# Patient Record
Sex: Female | Born: 1980 | Race: Black or African American | Hispanic: No | Marital: Single | Smoking: Never smoker
Health system: Southern US, Community
[De-identification: ages and names within clinical notes are randomized; demographics above are authoritative.]

## PROBLEM LIST (undated history)

## (undated) DIAGNOSIS — M549 Dorsalgia, unspecified: Secondary | ICD-10-CM

## (undated) DIAGNOSIS — G43909 Migraine, unspecified, not intractable, without status migrainosus: Secondary | ICD-10-CM

## (undated) HISTORY — PX: TONSILLECTOMY: SUR1361

## (undated) HISTORY — PX: CHOLECYSTECTOMY: SHX55

## (undated) HISTORY — PX: KNEE SURGERY: SHX244

## (undated) HISTORY — PX: ABDOMINAL HYSTERECTOMY: SHX81

---

## 2016-02-01 ENCOUNTER — Encounter (HOSPITAL_BASED_OUTPATIENT_CLINIC_OR_DEPARTMENT_OTHER): Payer: Self-pay

## 2016-02-01 ENCOUNTER — Emergency Department (HOSPITAL_BASED_OUTPATIENT_CLINIC_OR_DEPARTMENT_OTHER)
Admission: EM | Admit: 2016-02-01 | Discharge: 2016-02-01 | Disposition: A | Discharge status: Home / Self Care | Payer: Medicaid Other | Attending: Emergency Medicine | Admitting: Emergency Medicine

## 2016-02-01 DIAGNOSIS — G43809 Other migraine, not intractable, without status migrainosus: Secondary | ICD-10-CM | POA: Insufficient documentation

## 2016-02-01 DIAGNOSIS — G43909 Migraine, unspecified, not intractable, without status migrainosus: Secondary | ICD-10-CM | POA: Diagnosis present

## 2016-02-01 DIAGNOSIS — Z79899 Other long term (current) drug therapy: Secondary | ICD-10-CM | POA: Insufficient documentation

## 2016-02-01 HISTORY — DX: Migraine, unspecified, not intractable, without status migrainosus: G43.909

## 2016-02-01 MED ORDER — METHYLPREDNISOLONE SODIUM SUCC 125 MG IJ SOLR
125.0000 mg | Freq: Once | INTRAMUSCULAR | Status: AC
Start: 2016-02-01 — End: 2016-02-01
  Administered 2016-02-01: 125 mg via INTRAVENOUS
  Filled 2016-02-01: qty 2

## 2016-02-01 MED ORDER — KETOROLAC TROMETHAMINE 30 MG/ML IJ SOLN
30.0000 mg | Freq: Once | INTRAMUSCULAR | Status: AC
  Administered 2016-02-01: 30 mg via INTRAVENOUS
  Filled 2016-02-01: qty 1

## 2016-02-01 MED ORDER — MAGNESIUM SULFATE 2 GM/50ML IV SOLN
2.0000 g | Freq: Once | INTRAVENOUS | Status: AC
  Administered 2016-02-01: 2 g via INTRAVENOUS
  Filled 2016-02-01: qty 50

## 2016-02-01 MED ORDER — SODIUM CHLORIDE 0.9 % IV BOLUS (SEPSIS)
1000.0000 mL | Freq: Once | INTRAVENOUS | Status: AC
  Administered 2016-02-01: 1000 mL via INTRAVENOUS

## 2016-02-01 MED ORDER — PROCHLORPERAZINE EDISYLATE 5 MG/ML IJ SOLN
10.0000 mg | Freq: Once | INTRAMUSCULAR | Status: AC
  Administered 2016-02-01: 10 mg via INTRAVENOUS
  Filled 2016-02-01: qty 2

## 2016-02-01 NOTE — ED Provider Notes (Signed)
CSN: [id number]     Arrival date & time 02/01/16  1616 History   First MD Initiated Contact with Patient 02/01/16 1639     Chief Complaint  Patient presents with  . Migraine    (Consider location/radiation/quality/duration/timing/severity/associated sxs/prior Treatment) HPI  Blood pressure 120/92, pulse 107, temperature 98.1 F (36.7 C), temperature source Oral, resp. rate 20, height  (1.702 m), weight 182.346 kg, SpO2 100 %.  Christina Gray is a 35 y.o. female complaining of migraine onset 2 days ago in the right periorbital area, 8 out of 10, throbbing and exacerbated by light and sound. Patient taken multiple over-the-counter pain medications including Fioricet with little relief. States she has a history of migraines and this is typical for her chronic headache. Pt denies fever, rash, confusion, cervicalgia, LOC/syncope, change in vision, N/V, numbness, weakness, dysarthria, ataxia, thunderclap onset, exacerbation with exertion or valsalva, exacerbation in morning, CP, SOB, abdominal pain.   Past Medical History  Diagnosis Date  . Migraine    Past Surgical History  Procedure Laterality Date  . Tonsillectomy    . Cholecystectomy    . Abdominal hysterectomy     No family history on file. Social History  Substance Use Topics  . Smoking status: Never Smoker   . Smokeless tobacco: None  . Alcohol Use: No   OB History    No data available     Review of Systems   10 systems reviewed and found to be negative, except as noted in the HPI.   Allergies  Penicillins; Tramadol; and Zofran  Home Medications   Prior to Admission medications   Medication Sig Start Date End Date Taking? Authorizing Provider  Butalbital-APAP-Caffeine (FIORICET PO) Take by mouth.   Yes Historical Provider, MD  Rizatriptan Benzoate (MAXALT PO) Take by mouth.   Yes Historical Provider, MD   BP 120/92 mmHg  Pulse 107  Temp(Src) 98.1 F (36.7 C) (Oral)  Resp 20  Ht  (1.702 m)  Wt  182.346 kg  BMI 62.95 kg/m2  SpO2 100% Physical Exam  Constitutional: She is oriented to person, place, and time. She appears well-developed and well-nourished.  HENT:  Head: Normocephalic and atraumatic.  Mouth/Throat: Oropharynx is clear and moist.  Eyes: Conjunctivae and EOM are normal. Pupils are equal, round, and reactive to light.  No TTP of maxillary or frontal sinuses  No TTP or induration of temporal arteries bilaterally  Neck: Normal range of motion. Neck supple.  FROM to C-spine. Pt can touch chin to chest without discomfort. No TTP of midline cervical spine.   Cardiovascular: Normal rate, regular rhythm and intact distal pulses.   Pulmonary/Chest: Effort normal and breath sounds normal. No respiratory distress. She has no wheezes. She has no rales. She exhibits no tenderness.  Abdominal: Soft. Bowel sounds are normal. There is no tenderness.  Musculoskeletal: Normal range of motion. She exhibits no edema or tenderness.  Neurological: She is alert and oriented to person, place, and time. No cranial nerve deficit.  II-Visual fields grossly intact. III/IV/VI-Extraocular movements intact.  Pupils reactive bilaterally. V/VII-Smile symmetric, equal eyebrow raise,  facial sensation intact VIII- Hearing grossly intact IX/X-Normal gag XI-bilateral shoulder shrug XII-midline tongue extension Motor: 5/5 bilaterally with normal tone and bulk Cerebellar: Normal finger-to-nose  and normal heel-to-shin test.   Romberg negative Ambulates with a coordinated gait   Nursing note and vitals reviewed.   ED Course  Procedures (including critical care time) Labs Review Labs Reviewed  MAGNESIUM    Imaging Review No  results found. I have personally reviewed and evaluated these images and lab results as part of my medical decision-making.   EKG Interpretation None      MDM   Final diagnoses:  Other migraine without status migrainosus, not intractable    Filed Vitals:    02/01/16 1628 02/01/16 1846  BP: 120/92 122/95  Pulse: 107 87  Temp: 98.1 F (36.7 C)   TempSrc: Oral   Resp: 20 16  Height: 5\' 7"  (1.702 m)   Weight: 182.346 kg   SpO2: 100% 100%    Medications  sodium chloride 0.9 % bolus 1,000 mL (0 mLs Intravenous Stopped 02/01/16 1844)  prochlorperazine (COMPAZINE) injection 10 mg (10 mg Intravenous Given 02/01/16 1737)  methylPREDNISolone sodium succinate (SOLU-MEDROL) 125 mg/2 mL injection 125 mg (125 mg Intravenous Given 02/01/16 1737)  ketorolac (TORADOL) 30 MG/ML injection 30 mg (30 mg Intravenous Given 02/01/16 1737)  magnesium sulfate IVPB 2 g 50 mL (0 g Intravenous Stopped 02/01/16 1844)    Christina Gray is 35 y.o. female presenting with HA. Presentation is like pts typical HA and non concerning for [facility], ICH, Meningitis, or temporal arteritis. Pt is afebrile with no focal neuro deficits, nuchal rigidity, or change in vision. Pt is to follow up with PCP to discuss prophylactic medication. Pt verbalizes understanding and is agreeable with plan to dc.  Evaluation does not show pathology that would require ongoing emergent intervention or inpatient treatment. Pt is hemodynamically stable and mentating appropriately. Discussed findings and plan with patient/guardian, who agrees with care plan. All questions answered. Return precautions discussed and outpatient follow up given.      Wynetta Emeryicole Dalicia Kisner, PA-C 02/01/16 1849  Pricilla LovelessScott Goldston, MD 02/02/16 [id number]

## 2016-02-01 NOTE — ED Notes (Signed)
Patient states she needs to leave to go get her child her mother just called and said she got hurt. RN Sue LushAndrea notified.

## 2016-02-01 NOTE — Discharge Instructions (Signed)
Please follow with your primary care doctor in the next 2 days for a check-up. They must obtain records for further management.   Do not hesitate to return to the Emergency Department for any new, worsening or concerning symptoms.    Migraine Headache A migraine headache is an intense, throbbing pain on one or both sides of your head. A migraine can last for 30 minutes to several hours. CAUSES  The exact cause of a migraine headache is not always known. However, a migraine may be caused when nerves in the brain become irritated and release chemicals that cause inflammation. This causes pain. Certain things may also trigger migraines, such as:  Alcohol.  Smoking.  Stress.  Menstruation.  Aged cheeses.  Foods or drinks that contain nitrates, glutamate, aspartame, or tyramine.  Lack of sleep.  Chocolate.  Caffeine.  Hunger.  Physical exertion.  Fatigue.  Medicines used to treat chest pain (nitroglycerine), birth control pills, estrogen, and some blood pressure medicines. SIGNS AND SYMPTOMS  Pain on one or both sides of your head.  Pulsating or throbbing pain.  Severe pain that prevents daily activities.  Pain that is aggravated by any physical activity.  Nausea, vomiting, or both.  Dizziness.  Pain with exposure to bright lights, loud noises, or activity.  General sensitivity to bright lights, loud noises, or smells. Before you get a migraine, you may get warning signs that a migraine is coming (aura). An aura may include:  Seeing flashing lights.  Seeing bright spots, halos, or zigzag lines.  Having tunnel vision or blurred vision.  Having feelings of numbness or tingling.  Having trouble talking.  Having muscle weakness. DIAGNOSIS  A migraine headache is often diagnosed based on:  Symptoms.  Physical exam.  A CT scan or MRI of your head. These imaging tests cannot diagnose migraines, but they can help rule out other causes of  headaches. TREATMENT Medicines may be given for pain and nausea. Medicines can also be given to help prevent recurrent migraines.  HOME CARE INSTRUCTIONS  Only take over-the-counter or prescription medicines for pain or discomfort as directed by your health care provider. The use of long-term narcotics is not recommended.  Lie down in a dark, quiet room when you have a migraine.  Keep a journal to find out what may trigger your migraine headaches. For example, write down:  What you eat and drink.  How much sleep you get.  Any change to your diet or medicines.  Limit alcohol consumption.  Quit smoking if you smoke.  Get 7-9 hours of sleep, or as recommended by your health care provider.  Limit stress.  Keep lights dim if bright lights bother you and make your migraines worse. SEEK IMMEDIATE MEDICAL CARE IF:   Your migraine becomes severe.  You have a fever.  You have a stiff neck.  You have vision loss.  You have muscular weakness or loss of muscle control.  You start losing your balance or have trouble walking.  You feel faint or pass out.  You have severe symptoms that are different from your first symptoms. MAKE SURE YOU:   Understand these instructions.  Will watch your condition.  Will get help right away if you are not doing well or get worse.   This information is not intended to replace advice given to you by your health care provider. Make sure you discuss any questions you have with your health care provider.   Document Released: 09/05/2005 Document Revised: 09/26/2014 Document Reviewed:  05/13/2013 Elsevier Interactive Patient Education [employer].

## 2016-02-01 NOTE — ED Notes (Signed)
C/o migraine x 2 days-NAD-steady gait

## 2016-03-04 LAB — HM PAP SMEAR: HM PAP: NEGATIVE

## 2016-03-20 ENCOUNTER — Encounter (HOSPITAL_COMMUNITY): Payer: Self-pay | Admitting: *Deleted

## 2016-03-20 ENCOUNTER — Emergency Department (HOSPITAL_COMMUNITY)
Admission: EM | Admit: 2016-03-20 | Discharge: 2016-03-20 | Disposition: A | Discharge status: Home / Self Care | Payer: Medicaid Other | Attending: Emergency Medicine | Admitting: Emergency Medicine

## 2016-03-20 DIAGNOSIS — Z79899 Other long term (current) drug therapy: Secondary | ICD-10-CM | POA: Insufficient documentation

## 2016-03-20 DIAGNOSIS — G8929 Other chronic pain: Secondary | ICD-10-CM | POA: Insufficient documentation

## 2016-03-20 DIAGNOSIS — M549 Dorsalgia, unspecified: Secondary | ICD-10-CM | POA: Insufficient documentation

## 2016-03-20 MED ORDER — OXYCODONE-ACETAMINOPHEN 5-325 MG PO TABS: 1.0000 | ORAL_TABLET | Freq: Two times a day (BID) | ORAL | Status: DC | PRN

## 2016-03-20 MED ORDER — OXYCODONE-ACETAMINOPHEN 5-325 MG PO TABS
2.0000 | ORAL_TABLET | Freq: Once | ORAL | Status: AC
  Administered 2016-03-20: 2 via ORAL
  Filled 2016-03-20: qty 2

## 2016-03-20 MED ORDER — NAPROXEN 250 MG PO TABS
500.0000 mg | ORAL_TABLET | Freq: Once | ORAL | Status: AC
  Administered 2016-03-20: 500 mg via ORAL
  Filled 2016-03-20: qty 2

## 2016-03-20 NOTE — Discharge Instructions (Signed)
Please alternate between the naprosyn and percocet at home -and take percocet ONLY IF YOU ARE HAVING SEVERE PAIN. See your doctor on 6th as planned.   Back Exercises The following exercises strengthen the muscles that help to support the back. They also help to keep the lower back flexible. Doing these exercises can help to prevent back pain or lessen existing pain. If you have back pain or discomfort, try doing these exercises 2-3 times each day or as told by your health care provider. When the pain goes away, do them once each day, but increase the number of times that you repeat the steps for each exercise (do more repetitions). If you do not have back pain or discomfort, do these exercises once each day or as told by your health care provider. EXERCISES Single Knee to Chest Repeat these steps 3-5 times for each leg:  Lie on your back on a firm bed or the floor with your legs extended.  Bring one knee to your chest. Your other leg should stay extended and in contact with the floor.  Hold your knee in place by grabbing your knee or thigh.  Pull on your knee until you feel a gentle stretch in your lower back.  Hold the stretch for 10-30 seconds.  Slowly release and straighten your leg. Pelvic Tilt Repeat these steps 5-10 times:  Lie on your back on a firm bed or the floor with your legs extended.  Bend your knees so they are pointing toward the ceiling and your feet are flat on the floor.  Tighten your lower abdominal muscles to press your lower back against the floor. This motion will tilt your pelvis so your tailbone points up toward the ceiling instead of pointing to your feet or the floor.  With gentle tension and even breathing, hold this position for 5-10 seconds. Cat-Cow Repeat these steps until your lower back becomes more flexible:  Get into a hands-and-knees position on a firm surface. Keep your hands under your shoulders, and keep your knees under your hips. You may  place padding under your knees for comfort.  Let your head hang down, and point your tailbone toward the floor so your lower back becomes rounded like the back of a cat.  Hold this position for 5 seconds.  Slowly lift your head and point your tailbone up toward the ceiling so your back forms a sagging arch like the back of a cow.  Hold this position for 5 seconds. Press-Ups Repeat these steps 5-10 times:  Lie on your abdomen (face-down) on the floor.  Place your palms near your head, about shoulder-width apart.  While you keep your back as relaxed as possible and keep your hips on the floor, slowly straighten your arms to raise the top half of your body and lift your shoulders. Do not use your back muscles to raise your upper torso. You may adjust the placement of your hands to make yourself more comfortable.  Hold this position for 5 seconds while you keep your back relaxed.  Slowly return to lying flat on the floor. Bridges Repeat these steps 10 times:  Lie on your back on a firm surface.  Bend your knees so they are pointing toward the ceiling and your feet are flat on the floor.  Tighten your buttocks muscles and lift your buttocks off of the floor until your waist is at almost the same height as your knees. You should feel the muscles working in your buttocks and  the back of your thighs. If you do not feel these muscles, slide your feet 1-2 inches farther away from your buttocks.  Hold this position for 3-5 seconds.  Slowly lower your hips to the starting position, and allow your buttocks muscles to relax completely. If this exercise is too easy, try doing it with your arms crossed over your chest. Abdominal Crunches Repeat these steps 5-10 times:  Lie on your back on a firm bed or the floor with your legs extended.  Bend your knees so they are pointing toward the ceiling and your feet are flat on the floor.  Cross your arms over your chest.  Tip your chin slightly  toward your chest without bending your neck.  Tighten your abdominal muscles and slowly raise your trunk (torso) high enough to lift your shoulder blades a tiny bit off of the floor. Avoid raising your torso higher than that, because it can put too much stress on your low back and it does not help to strengthen your abdominal muscles.  Slowly return to your starting position. Back Lifts Repeat these steps 5-10 times:  Lie on your abdomen (face-down) with your arms at your sides, and rest your forehead on the floor.  Tighten the muscles in your legs and your buttocks.  Slowly lift your chest off of the floor while you keep your hips pressed to the floor. Keep the back of your head in line with the curve in your back. Your eyes should be looking at the floor.  Hold this position for 3-5 seconds.  Slowly return to your starting position. SEEK MEDICAL CARE IF:  Your back pain or discomfort gets much worse when you do an exercise.  Your back pain or discomfort does not lessen within 2 hours after you exercise. If you have any of these problems, stop doing these exercises right away. Do not do them again unless your health care provider says that you can. SEEK IMMEDIATE MEDICAL CARE IF:  You develop sudden, severe back pain. If this happens, stop doing the exercises right away. Do not do them again unless your health care provider says that you can.   This information is not intended to replace advice given to you by your health care provider. Make sure you discuss any questions you have with your health care provider.   Document Released: 10/13/2004 Document Revised: 05/27/2015 Document Reviewed: 10/30/2014 Elsevier Interactive Patient Education 2016 Elsevier Inc.  hronic Back Pain  When back pain lasts longer than 3 months, it is called chronic back pain.People with chronic back pain often go through certain periods that are more intense (flare-ups).  CAUSES Chronic back pain can be  caused by wear and tear (degeneration) on different structures in your back. These structures include:  The bones of your spine (vertebrae) and the joints surrounding your spinal cord and nerve roots (facets).  The strong, fibrous tissues that connect your vertebrae (ligaments). Degeneration of these structures may result in pressure on your nerves. This can lead to constant pain. HOME CARE INSTRUCTIONS  Avoid bending, heavy lifting, prolonged sitting, and activities which make the problem worse.  Take brief periods of rest throughout the day to reduce your pain. Lying down or standing usually is better than sitting while you are resting.  Take over-the-counter or prescription medicines only as directed by your caregiver. SEEK IMMEDIATE MEDICAL CARE IF:   You have weakness or numbness in one of your legs or feet.  You have trouble controlling your bladder  or bowels.  You have nausea, vomiting, abdominal pain, shortness of breath, or fainting.   This information is not intended to replace advice given to you by your health care provider. Make sure you discuss any questions you have with your health care provider.   Document Released: 10/13/2004 Document Revised: 11/28/2011 Document Reviewed: 02/23/2015 Elsevier Interactive Patient Education [employer] Elsevier Inc[employer]

## 2016-03-20 NOTE — ED Notes (Signed)
The pt is c/o headaches and back pain for a year  She has been taking percocet 10s for her back pain  She took her last one June 30  lmp hys

## 2016-03-20 NOTE — ED Provider Notes (Signed)
CSN: [id number]     Arrival date & time 03/20/16  1920 History   First MD Initiated Contact with Patient 03/20/16 1956     Chief Complaint  Patient presents with  . Headache  . back pain      (Consider location/radiation/quality/duration/timing/severity/associated sxs/prior Treatment) HPI Comments: Pt comes in with cc of back pain and headaches. She is having pain and headache x 1 year, saw her pcp who started her on percocet 10 mg. She was prescribed 90 table 02/18/16, and she ran out on 03/19/16. Pt reports that her pain got severe last night, she was inable to rest. She is to see her pcp on 03/24/16. She was asked by her doctor to come to the ER as the clinic is closed until 5th.   Patient is a 35 y.o. female presenting with headaches. The history is provided by the patient.  Headache Associated symptoms: back pain   Associated symptoms: no abdominal pain, no nausea, no neck pain and no vomiting     Past Medical History  Diagnosis Date  . Migraine    Past Surgical History  Procedure Laterality Date  . Tonsillectomy    . Cholecystectomy    . Abdominal hysterectomy     No family history on file. Social History  Substance Use Topics  . Smoking status: Never Smoker   . Smokeless tobacco: None  . Alcohol Use: No   OB History    No data available     Review of Systems  Constitutional: Positive for activity change.  Respiratory: Negative for shortness of breath.   Cardiovascular: Negative for chest pain.  Gastrointestinal: Negative for nausea, vomiting and abdominal pain.  Genitourinary: Negative for dysuria.  Musculoskeletal: Positive for back pain. Negative for neck pain.  Neurological: Positive for headaches.      Allergies  Penicillins; Tramadol; and Zofran  Home Medications   Prior to Admission medications   Medication Sig Start Date End Date Taking? Authorizing Provider  Butalbital-APAP-Caffeine (FIORICET PO) Take by mouth.    Historical Provider, MD   oxyCODONE-acetaminophen (PERCOCET/ROXICET) 5-325 MG tablet Take 1 tablet by mouth every 12 (twelve) hours as needed for severe pain. 03/20/16   Derwood KaplanAnkit Iman Reinertsen, MD  Rizatriptan Benzoate (MAXALT PO) Take by mouth.    Historical Provider, MD   BP 110/49 mmHg  Pulse 107  Temp(Src) 98 F (36.7 C)  Resp 20  Ht 5\' 7"  (1.702 m)  Wt 398 lb 9 oz (180.787 kg)  BMI 62.41 kg/m2  SpO2 98% Physical Exam  Constitutional: She is oriented to person, place, and time. She appears well-developed.  HENT:  Head: Normocephalic and atraumatic.  Eyes: EOM are normal.  Neck: Normal range of motion. Neck supple.  Cardiovascular: Normal rate.   Pulmonary/Chest: Effort normal.  Abdominal: Bowel sounds are normal.  Neurological: She is alert and oriented to person, place, and time.  Skin: Skin is warm and dry.  Nursing note and vitals reviewed.   ED Course  Procedures (including critical care time) Labs Review Labs Reviewed - No data to display  Imaging Review No results found. I have personally reviewed and evaluated these images and lab results as part of my medical decision-making.   EKG Interpretation None      MDM   Final diagnoses:  Chronic back pain    Looks like patient ran out of her meds in 30 days as she was taking scheduled doses on percocet instead of prn. She brought her pills and is remorseful about not  spacing her meds. Will give her 10 mg percocet here, and d/c with 6 x 5 mg tablets of percocets along with naprosyn. She has been encouraged to lose weight.    Derwood KaplanAnkit Mearl Harewood, MD 03/20/16 2025

## 2017-01-18 ENCOUNTER — Encounter: Payer: Self-pay | Admitting: Internal Medicine

## 2019-11-26 ENCOUNTER — Encounter (HOSPITAL_BASED_OUTPATIENT_CLINIC_OR_DEPARTMENT_OTHER): Payer: Self-pay | Admitting: Emergency Medicine

## 2019-11-26 ENCOUNTER — Other Ambulatory Visit: Payer: Self-pay

## 2019-11-26 ENCOUNTER — Emergency Department (HOSPITAL_BASED_OUTPATIENT_CLINIC_OR_DEPARTMENT_OTHER)
Admission: EM | Admit: 2019-11-26 | Discharge: 2019-11-26 | Disposition: A | Discharge status: Home / Self Care | Payer: Medicaid Other | Attending: Emergency Medicine | Admitting: Emergency Medicine

## 2019-11-26 DIAGNOSIS — M5441 Lumbago with sciatica, right side: Secondary | ICD-10-CM | POA: Insufficient documentation

## 2019-11-26 DIAGNOSIS — M5489 Other dorsalgia: Secondary | ICD-10-CM | POA: Diagnosis present

## 2019-11-26 DIAGNOSIS — Z79899 Other long term (current) drug therapy: Secondary | ICD-10-CM | POA: Diagnosis not present

## 2019-11-26 DIAGNOSIS — G8929 Other chronic pain: Secondary | ICD-10-CM

## 2019-11-26 HISTORY — DX: Dorsalgia, unspecified: M54.9

## 2019-11-26 HISTORY — DX: Morbid (severe) obesity due to excess calories: E66.01

## 2019-11-26 NOTE — ED Triage Notes (Addendum)
Back pain for "months" but this morning is getting unbearable. No known injury.  Pain in right low back that radiates down into right leg. Sts she was being seen at [facility] for her back pain but lost her Medicaid so she has not been able to go.

## 2019-11-26 NOTE — Discharge Instructions (Signed)
Please follow-up with your primary care doctor regarding your back pain.

## 2019-11-26 NOTE — ED Provider Notes (Signed)
[facility] Provider Note   CSN: [id number] Arrival date & time: 11/26/19  0755     History Chief Complaint  Patient presents with  . Back Pain    Hallie Ertl is a 39 y.o. female w/ hx of obesity, migraines, chronic back pain, presenting to the ED with back pain.  She reports acute on chronic flare of her usual back pain, located on her lower right side, that radiates down her right leg.  She tells me she ran out of medicaid recently and hasn't been able to get to her normal doctor, but was told today she would be eligible again for medicaid.  She reports her pain is worse with palpation of her back and leg movement.  No recent trauma.  No numbness in her feet.  No fever, chills, numbness, or objective weakness on exam. No saddle anesthesia, urinary or fecal incontinence or retention. No reported history of immunosuppression, IV drug use, cancer, recent spinal surgery, recent trauma, or falls.    HPI     Past Medical History:  Diagnosis Date  . Back pain   . Migraine   . Morbid obesity ([place])     There are no problems to display for this patient.   Past Surgical History:  Procedure Laterality Date  . ABDOMINAL HYSTERECTOMY    . CHOLECYSTECTOMY    . KNEE SURGERY    . TONSILLECTOMY       OB History   No obstetric history on file.     No family history on file.  Social History   Tobacco Use  . Smoking status: Never Smoker  . Smokeless tobacco: Never Used  Substance Use Topics  . Alcohol use: No  . Drug use: No    Home Medications Prior to Admission medications   Medication Sig Start Date End Date Taking? Authorizing Provider  Butalbital-APAP-Caffeine (FIORICET PO) Take by mouth.    [provider]    Allergies    Hydrocodone, Penicillins, Tramadol, and Zofran [ondansetron hcl]  Review of Systems   Review of Systems  Constitutional: Negative for chills and fever.  Musculoskeletal: Positive for arthralgias, back  pain and myalgias.  Skin: Negative for color change and rash.  Neurological: Negative for syncope and numbness.  All other systems reviewed and are negative.   Physical Exam Updated Vital Signs BP (!) 177/104 (BP Location: Right Arm)   Pulse 95   Temp 98.9 F (37.2 C) (Oral)   Resp 20   Ht 5\' 7"  (1.702 m)   Wt (!) 174.3 kg   SpO2 100%   BMI 60.19 kg/m   Physical Exam Vitals and nursing note reviewed.  Constitutional:      Appearance: She is well-developed. She is obese.  HENT:     Head: Normocephalic and atraumatic.  Eyes:     Conjunctiva/sclera: Conjunctivae normal.  Cardiovascular:     Rate and Rhythm: Normal rate and regular rhythm.     Pulses: Normal pulses.  Pulmonary:     Effort: Pulmonary effort is normal. No respiratory distress.     Breath sounds: Normal breath sounds.  Abdominal:     Palpations: Abdomen is soft.     Tenderness: There is no abdominal tenderness.  Musculoskeletal:     Cervical back: Neck supple.     Comments: Significant paraspinal tenderness, hyperalgesia of the back even to light touch  Skin:    General: Skin is warm and dry.  Neurological:     General: No focal  deficit present.     Mental Status: She is alert and oriented to person, place, and time.     Sensory: No sensory deficit.     Motor: No weakness.     ED Results / Procedures / Treatments   Labs (all labs ordered are listed, but only abnormal results are displayed) Labs Reviewed - No data to display  EKG None  Radiology No results found.  Procedures Procedures (including critical care time)  Medications Ordered in ED Medications - No data to display  ED Course  I have reviewed the triage vital signs and the nursing notes.  Pertinent labs & imaging results that were available during my care of the patient were reviewed by me and considered in my medical decision making (see chart for details).  39 yo female presenting with acute on chronic back pain.  May have  run out of medications, and was unable to see her doctor for financial reasons.  She is tearful on exam.  Hyper-sensitive to even mild touch of her back; even superficial palpation of her adipose tissue appears to cause her pain.  Her PDMP record shows extensive percocet and lyrica prescription history.  I suspect she may have unfortunately developed quite a tolerance for opioids, which has subsequently lowered her pain threshold and led to this level of hyperalgesia.  She has sciatica without evidence of cord compromise.  I offered her motrin, flexeril, and steroids, but she refused all of them, telling me "I've tried all that stuff in the past, it doesn't work for me."  I told her I would not be able to fill a narcotic script in the ER.  She understands. She will be discharged.   Final Clinical Impression(s) / ED Diagnoses Final diagnoses:  Chronic right-sided low back pain with right-sided sciatica    Rx / DC Orders ED Discharge Orders    None       Jolean Madariaga, Kermit Balo, MD 11/26/19 1733

## 2020-09-21 ENCOUNTER — Emergency Department (HOSPITAL_BASED_OUTPATIENT_CLINIC_OR_DEPARTMENT_OTHER): Payer: Medicaid Other

## 2020-09-21 ENCOUNTER — Encounter (HOSPITAL_BASED_OUTPATIENT_CLINIC_OR_DEPARTMENT_OTHER): Payer: Self-pay | Admitting: Emergency Medicine

## 2020-09-21 ENCOUNTER — Emergency Department (HOSPITAL_BASED_OUTPATIENT_CLINIC_OR_DEPARTMENT_OTHER)
Admission: EM | Admit: 2020-09-21 | Discharge: 2020-09-21 | Disposition: A | Discharge status: Home / Self Care | Payer: Medicaid Other | Attending: Emergency Medicine | Admitting: Emergency Medicine

## 2020-09-21 ENCOUNTER — Other Ambulatory Visit: Payer: Self-pay

## 2020-09-21 DIAGNOSIS — R000 Tachycardia, unspecified: Secondary | ICD-10-CM | POA: Insufficient documentation

## 2020-09-21 DIAGNOSIS — J069 Acute upper respiratory infection, unspecified: Secondary | ICD-10-CM | POA: Insufficient documentation

## 2020-09-21 DIAGNOSIS — U071 COVID-19: Secondary | ICD-10-CM | POA: Diagnosis not present

## 2020-09-21 DIAGNOSIS — R059 Cough, unspecified: Secondary | ICD-10-CM | POA: Diagnosis present

## 2020-09-21 DIAGNOSIS — R519 Headache, unspecified: Secondary | ICD-10-CM

## 2020-09-21 MED ORDER — PROCHLORPERAZINE EDISYLATE 10 MG/2ML IJ SOLN
10.0000 mg | Freq: Once | INTRAMUSCULAR | Status: AC
  Administered 2020-09-21: 10 mg via INTRAVENOUS
  Filled 2020-09-21: qty 2

## 2020-09-21 MED ORDER — SODIUM CHLORIDE 0.9 % IV BOLUS
500.0000 mL | Freq: Once | INTRAVENOUS | Status: AC
  Administered 2020-09-21: 500 mL via INTRAVENOUS

## 2020-09-21 MED ORDER — DIPHENHYDRAMINE HCL 50 MG/ML IJ SOLN
25.0000 mg | Freq: Once | INTRAMUSCULAR | Status: AC
  Administered 2020-09-21: 25 mg via INTRAVENOUS
  Filled 2020-09-21: qty 1

## 2020-09-21 MED ORDER — ACETAMINOPHEN 500 MG PO TABS
1000.0000 mg | ORAL_TABLET | Freq: Once | ORAL | Status: AC
  Administered 2020-09-21: 1000 mg via ORAL
  Filled 2020-09-21: qty 2

## 2020-09-21 NOTE — ED Triage Notes (Signed)
Body aches, sore throat, fever, cough x 2 days.  Recent exposure to covid

## 2020-09-21 NOTE — ED Provider Notes (Signed)
[facility] EMERGENCY DEPARTMENT Provider Note   CSN: [id number] Arrival date & time: 09/21/20  1136     History Chief Complaint  Patient presents with  . URI    Christina Gray is a 40 y.o. female.  HPI Patient is a 40 year old female with a medical history as noted below.  She presents the emergency department with cough, subjective fevers, sore throat, body aches, chest pain when coughing, nausea, diffuse headache.  History of migraines. States her HA is similar to past migraines. She has not been vaccinated for COVID-19.  She notes a recent COVID-19 exposure with a close friend.    Past Medical History:  Diagnosis Date  . Back pain   . Migraine   . Morbid obesity ([facility])     There are no problems to display for this patient.   Past Surgical History:  Procedure Laterality Date  . ABDOMINAL HYSTERECTOMY    . CHOLECYSTECTOMY    . KNEE SURGERY    . TONSILLECTOMY       OB History   No obstetric history on file.     No family history on file.  Social History   Tobacco Use  . Smoking status: Never Smoker  . Smokeless tobacco: Never Used  Substance Use Topics  . Alcohol use: No  . Drug use: No    Home Medications Prior to Admission medications   Medication Sig Start Date End Date Taking? Authorizing Provider  Butalbital-APAP-Caffeine (FIORICET PO) Take by mouth.    [provider]    Allergies    Ondansetron hcl, Penicillins, Tramadol, and Hydrocodone  Review of Systems   Review of Systems  Constitutional: Positive for activity change, appetite change, chills, fatigue and fever.  HENT: Positive for congestion, rhinorrhea, sneezing and sore throat.   Respiratory: Positive for cough. Negative for shortness of breath.   Cardiovascular: Positive for chest pain (when coughing).  Gastrointestinal: Positive for diarrhea and nausea. Negative for abdominal pain and vomiting.  Neurological: Positive for headaches.   Physical Exam Updated Vital  Signs BP (!) 128/100   Pulse (!) 104   Temp 99.1 F (37.3 C) (Oral)   Resp 20   Ht 5\' 8"  (1.727 m)   Wt (!) 157.4 kg   SpO2 98%   BMI 52.76 kg/m   Physical Exam Vitals and nursing note reviewed.  Constitutional:      General: She is not in acute distress.    Appearance: Normal appearance. She is obese. She is not ill-appearing, toxic-appearing or diaphoretic.  HENT:     Head: Normocephalic and atraumatic.     Right Ear: External ear normal.     Left Ear: External ear normal.     Nose: Nose normal.     Mouth/Throat:     Mouth: Mucous membranes are moist.     Pharynx: Oropharynx is clear. No oropharyngeal exudate or posterior oropharyngeal erythema.     Comments: Status post tonsillectomy.  Uvula midline.  No significant erythema noted in the posterior oropharynx.  Readily handling secretions.  No hot potato voice. Eyes:     General: No scleral icterus.       Right eye: No discharge.        Left eye: No discharge.     Extraocular Movements: Extraocular movements intact.     Conjunctiva/sclera: Conjunctivae normal.     Pupils: Pupils are equal, round, and reactive to light.  Cardiovascular:     Rate and Rhythm: Regular rhythm. Tachycardia present.  Pulses: Normal pulses.     Heart sounds: Normal heart sounds. No murmur heard. No friction rub. No gallop.   Pulmonary:     Effort: Pulmonary effort is normal. No respiratory distress.     Breath sounds: Normal breath sounds. No stridor. No wheezing, rhonchi or rales.  Abdominal:     General: Abdomen is flat.     Tenderness: There is no abdominal tenderness.  Musculoskeletal:        General: Normal range of motion.     Cervical back: Normal range of motion and neck supple. No tenderness.  Skin:    General: Skin is warm and dry.  Neurological:     General: No focal deficit present.     Mental Status: She is alert and oriented to person, place, and time.     Comments: No gross deficits.  Moving all 4 extremities with ease.   Psychiatric:        Mood and Affect: Mood normal.        Behavior: Behavior normal.    ED Results / Procedures / Treatments   Labs (all labs ordered are listed, but only abnormal results are displayed) Labs Reviewed  SARS CORONAVIRUS 2 (TAT 6-24 HRS)   EKG None  Radiology DG Chest Portable 1 View  Result Date: 09/21/2020 CLINICAL DATA:  Cough and shortness of breath for 2 days, recent COVID exposure, initial encounter EXAM: PORTABLE CHEST 1 VIEW COMPARISON:  None. FINDINGS: The heart size and mediastinal contours are within normal limits. Both lungs are clear. The visualized skeletal structures are unremarkable. IMPRESSION: No active disease. Electronically Signed   By: Alcide Clever M.D.   On: 09/21/2020 15:25    Procedures Procedures   Medications Ordered in ED Medications  acetaminophen (TYLENOL) tablet 1,000 mg (1,000 mg Oral Given 09/21/20 1307)  prochlorperazine (COMPAZINE) injection 10 mg (10 mg Intravenous Given 09/21/20 1613)  diphenhydrAMINE (BENADRYL) injection 25 mg (25 mg Intravenous Given 09/21/20 1613)  sodium chloride 0.9 % bolus 500 mL (500 mLs Intravenous New Bag/Given 09/21/20 1612)   ED Course  I have reviewed the triage vital signs and the nursing notes.  Pertinent labs & imaging results that were available during my care of the patient were reviewed by me and considered in my medical decision making (see chart for details).  Clinical Course as of 09/21/20 1650  Mon Sep 21, 2020  1528 DG Chest Portable 1 View IMPRESSION: No active disease. [LJ]    Clinical Course User Index [LJ] Placido Sou, PA-C   MDM Rules/Calculators/A&P                          Patient is a 40 year old female who presents to the emergency department with a multitude of complaints consistent with a viral URI.  Likely COVID-19.  Patient has not been vaccinated for COVID-19 and reports a recent sick contact with COVID-19.  Patient is mildly tachycardic around 100 bpm.  No murmurs,  rubs, or gallops.  Lungs are clear to auscultation bilaterally.  No evidence of hypoxia.  Chest x-ray is negative.   Patient ambulated with no evidence of hypoxia.  COVID-19 test is pending.  Patient reports a history of migraines.  States her headache is consistent with prior migraines.  She was given a migraine cocktail with relief.  She is now sleeping comfortably.  Moving all 4 extremities with ease.  No gross deficits.  A&O x3.  Ambulatory without assistance.  Recommended continued use  of Tylenol for management of fevers and body aches.  We discussed return precautions.  Recommended checking her COVID-19 results on MyChart.  Her questions were answered and she was amicable at the time of discharge.  Final Clinical Impression(s) / ED Diagnoses Final diagnoses:  Viral URI with cough  Bad headache   Rx / DC Orders ED Discharge Orders    None       Rayna Sexton, PA-C 09/21/20 1650    Truddie Hidden, MD 09/22/20 [phone]

## 2020-09-21 NOTE — Discharge Instructions (Addendum)
Like we discussed, please check the results of your COVID-19 test on MyChart.  This should be available later today or tomorrow.  If it is positive you need to quarantine for 10 to 14 days.  Continue to take Tylenol for management of your fevers and body aches.  Follow the instructions on the bottle.  Make sure you are staying hydrated.  I would recommend purchasing a pulse oximeter.  You can find these on [employer] for $10-$20.  If your oxygen saturations dropped into the 80s and stay there for more than 3 to 5 minutes, please return to the emergency department for reevaluation.  Please follow-up with your regular doctor as needed.  It was a pleasure to meet you.

## 2020-09-21 NOTE — Progress Notes (Signed)
Pt ambulated per PA/MD request. Pre SpO2 100% on RA, HR 112. Post ambulation SpO2 on RA 99%, HR 120. Pt endorses light headedness, no shortness of breath. PA/MD notified. No new orders received at this time. RT will continue to monitor and be available as needed.

## 2020-09-22 LAB — SARS CORONAVIRUS 2 (TAT 6-24 HRS): SARS Coronavirus 2: POSITIVE — AB

## 2021-04-24 ENCOUNTER — Emergency Department (HOSPITAL_BASED_OUTPATIENT_CLINIC_OR_DEPARTMENT_OTHER): Payer: Medicaid Other

## 2021-04-24 ENCOUNTER — Other Ambulatory Visit: Payer: Self-pay

## 2021-04-24 ENCOUNTER — Encounter (HOSPITAL_BASED_OUTPATIENT_CLINIC_OR_DEPARTMENT_OTHER): Payer: Self-pay

## 2021-04-24 ENCOUNTER — Emergency Department (HOSPITAL_BASED_OUTPATIENT_CLINIC_OR_DEPARTMENT_OTHER)
Admission: EM | Admit: 2021-04-24 | Discharge: 2021-04-25 | Disposition: A | Discharge status: Home / Self Care | Payer: Medicaid Other | Attending: Emergency Medicine | Admitting: Emergency Medicine

## 2021-04-24 DIAGNOSIS — R059 Cough, unspecified: Secondary | ICD-10-CM | POA: Diagnosis present

## 2021-04-24 DIAGNOSIS — U071 COVID-19: Secondary | ICD-10-CM | POA: Insufficient documentation

## 2021-04-24 MED ORDER — ALBUTEROL SULFATE HFA 108 (90 BASE) MCG/ACT IN AERS
2.0000 | INHALATION_SPRAY | Freq: Once | RESPIRATORY_TRACT | Status: AC
  Administered 2021-04-24: 2 via RESPIRATORY_TRACT
  Filled 2021-04-24: qty 6.7

## 2021-04-24 MED ORDER — IBUPROFEN 400 MG PO TABS
400.0000 mg | ORAL_TABLET | Freq: Once | ORAL | Status: AC
  Administered 2021-04-24: 400 mg via ORAL
  Filled 2021-04-24: qty 1

## 2021-04-24 NOTE — ED Provider Notes (Signed)
[facility] EMERGENCY DEPARTMENT Provider Note   CSN: [id number] Arrival date & time: 04/24/21  2040     History Chief Complaint  Patient presents with   Cough    Christina Gray is a 40 y.o. female.  The history is provided by the patient.  Cough She has history of migraines, morbid obesity, and comes in complaining of cough and body aches and sore throat which started this morning.  Pain is severe and she rates it at 10/10.  Denies dyspnea.  She denies fever or chills.  There has been no rhinorrhea.  Cough is productive of a small amount of clear sputum.  She denies nausea, vomiting, diarrhea.  She is taking acetaminophen at home without relief.  She denies any sick contacts.  She has received 2 doses of COVID-19 vaccine.   Past Medical History:  Diagnosis Date   Back pain    Migraine    Morbid obesity ([facility])     There are no problems to display for this patient.   Past Surgical History:  Procedure Laterality Date   ABDOMINAL HYSTERECTOMY     CHOLECYSTECTOMY     KNEE SURGERY     TONSILLECTOMY       OB History   No obstetric history on file.     History reviewed. No pertinent family history.  Social History   Tobacco Use   Smoking status: Never   Smokeless tobacco: Never  Substance Use Topics   Alcohol use: No   Drug use: No    Home Medications Prior to Admission medications   Medication Sig Start Date End Date Taking? Authorizing Provider  Butalbital-APAP-Caffeine (FIORICET PO) Take by mouth.    [provider]    Allergies    Ondansetron hcl, Penicillins, Tramadol, and Hydrocodone  Review of Systems   Review of Systems  Respiratory:  Positive for cough.   All other systems reviewed and are negative.  Physical Exam Updated Vital Signs BP (!) 138/95 (BP Location: Left Arm)   Pulse (!) 113   Temp 98.8 F (37.1 C) (Oral)   Resp 18   Ht 5\' 8"  (1.727 m)   Wt (!) 169.6 kg   SpO2 100%   BMI 56.87 kg/m   Physical Exam Vitals  and nursing note reviewed.  Morbidly obese 40 year old female, resting comfortably and in no acute distress. Vital signs are significant for mildly elevated blood pressure and mildly elevated heart rate. Oxygen saturation is 100%, which is normal. Head is normocephalic and atraumatic. PERRLA, EOMI. Oropharynx is mildly erythematous without exudate.  There is no pooling of secretions.  Phonation is normal. Neck is nontender and supple without adenopathy or JVD. Back is nontender and there is no CVA tenderness. Lungs are clear without rales, wheezes, or rhonchi. Chest is nontender. Heart has regular rate and rhythm without murmur. Abdomen is soft, flat, nontender without masses or hepatosplenomegaly and peristalsis is normoactive. Extremities have no cyanosis or edema, full range of motion is present. Skin is warm and dry without rash. Neurologic: Mental status is normal, cranial nerves are intact, there are no motor or sensory deficits.  ED Results / Procedures / Treatments   Labs (all labs ordered are listed, but only abnormal results are displayed) Labs Reviewed  RESP PANEL BY RT-PCR (FLU A&B, COVID) ARPGX2 - Abnormal; Notable for the following components:      Result Value   SARS Coronavirus 2 by RT PCR POSITIVE (*)    All other components within  normal limits  GROUP A STREP BY PCR    Radiology DG Chest Port 1 View  Result Date: 04/25/2021 CLINICAL DATA:  Cough EXAM: PORTABLE CHEST 1 VIEW COMPARISON:  09/21/2020 FINDINGS: Increasing right infrahilar airspace opacity. Left lung clear. No effusions. Heart is normal size. No acute bony abnormality. IMPRESSION: Vague right infrahilar opacity could reflect developing pneumonia. Electronically Signed   By: Charlett Nose M.D.   On: 04/25/2021 00:11    Procedures Procedures   Medications Ordered in ED Medications  predniSONE (DELTASONE) tablet 60 mg (has no administration in time range)  oxyCODONE-acetaminophen (PERCOCET/ROXICET) 5-325 MG  per tablet 1 tablet (has no administration in time range)  albuterol (VENTOLIN HFA) 108 (90 Base) MCG/ACT inhaler 2 puff (2 puffs Inhalation Given 04/24/21 2343)  ibuprofen (ADVIL) tablet 400 mg (400 mg Oral Given 04/24/21 2346)    ED Course  I have reviewed the triage vital signs and the nursing notes.  Pertinent labs & imaging results that were available during my care of the patient were reviewed by me and considered in my medical decision making (see chart for details).   MDM Rules/Calculators/A&P                         Viral respiratory tract infection with cough, possible COVID-19.  Respiratory pathogen PCR is sent as well as strep PCR.  Old records are reviewed, and she had an ED visit in January with similar symptoms at which time she was positive for COVID-19.  She will be given a dose of ibuprofen for pain, and a therapeutic trial of albuterol inhaler.  She states that her cough is not improved with albuterol, but she is not noted to be coughing when I go back to reevaluate her.  Chest x-ray showed questionable area of developing infiltrate.  Respiratory pathogen panel is positive for COVID-19.  Clinically, she has COVID-19 and not pneumonia, will not start antibiotics.  She does not have any risk factors for severe disease, does not need antiviral treatment.  She is discharged with a short course of prednisone, told to continue using over-the-counter NSAIDs and acetaminophen for fever and pain.  Continue using albuterol as needed for cough.  Return precautions discussed.  Final Clinical Impression(s) / ED Diagnoses Final diagnoses:  COVID-19 virus infection    Rx / DC Orders ED Discharge Orders          Ordered    predniSONE (DELTASONE) 50 MG tablet  Daily        04/25/21 0119             Dione Booze, MD 04/25/21 [phone]

## 2021-04-24 NOTE — ED Triage Notes (Signed)
Pt c/o cough, sore throat, body aches, back pain. Denies SOB/Chest pain

## 2021-04-25 LAB — GROUP A STREP BY PCR: Group A Strep by PCR: NOT DETECTED

## 2021-04-25 LAB — RESP PANEL BY RT-PCR (FLU A&B, COVID) ARPGX2
Influenza A by PCR: NEGATIVE
Influenza B by PCR: NEGATIVE
SARS Coronavirus 2 by RT PCR: POSITIVE — AB

## 2021-04-25 MED ORDER — PREDNISONE 50 MG PO TABS
60.0000 mg | ORAL_TABLET | Freq: Once | ORAL | Status: AC
  Administered 2021-04-25: 60 mg via ORAL
  Filled 2021-04-25: qty 1

## 2021-04-25 MED ORDER — PREDNISONE 50 MG PO TABS: 50.0000 mg | ORAL_TABLET | Freq: Every day | ORAL | 0 refills | Status: AC

## 2021-04-25 MED ORDER — OXYCODONE-ACETAMINOPHEN 5-325 MG PO TABS
1.0000 | ORAL_TABLET | Freq: Once | ORAL | Status: AC
  Administered 2021-04-25: 1 via ORAL
  Filled 2021-04-25: qty 1

## 2021-04-25 NOTE — Discharge Instructions (Addendum)
Rest.  Drink plenty of fluids.  You need to quarantine for the next 5 days.  Take ibuprofen or naproxen as needed for pain or fever.  You may add acetaminophen to get additional pain relief.  Combining acetaminophen with ibuprofen or naproxen gives you better pain and fever control than either medication by itself.  Return to the emergency department if you are having any new or concerning symptoms.

## 2022-07-13 IMAGING — DX DG CHEST 1V PORT
1 series · 1 of 1 positions shown · non-contrast
Comparison: None.

CLINICAL DATA: Cough and shortness of breath for 2 days, recent
COVID exposure, initial encounter

EXAM:
PORTABLE CHEST 1 VIEW

[chest ap]
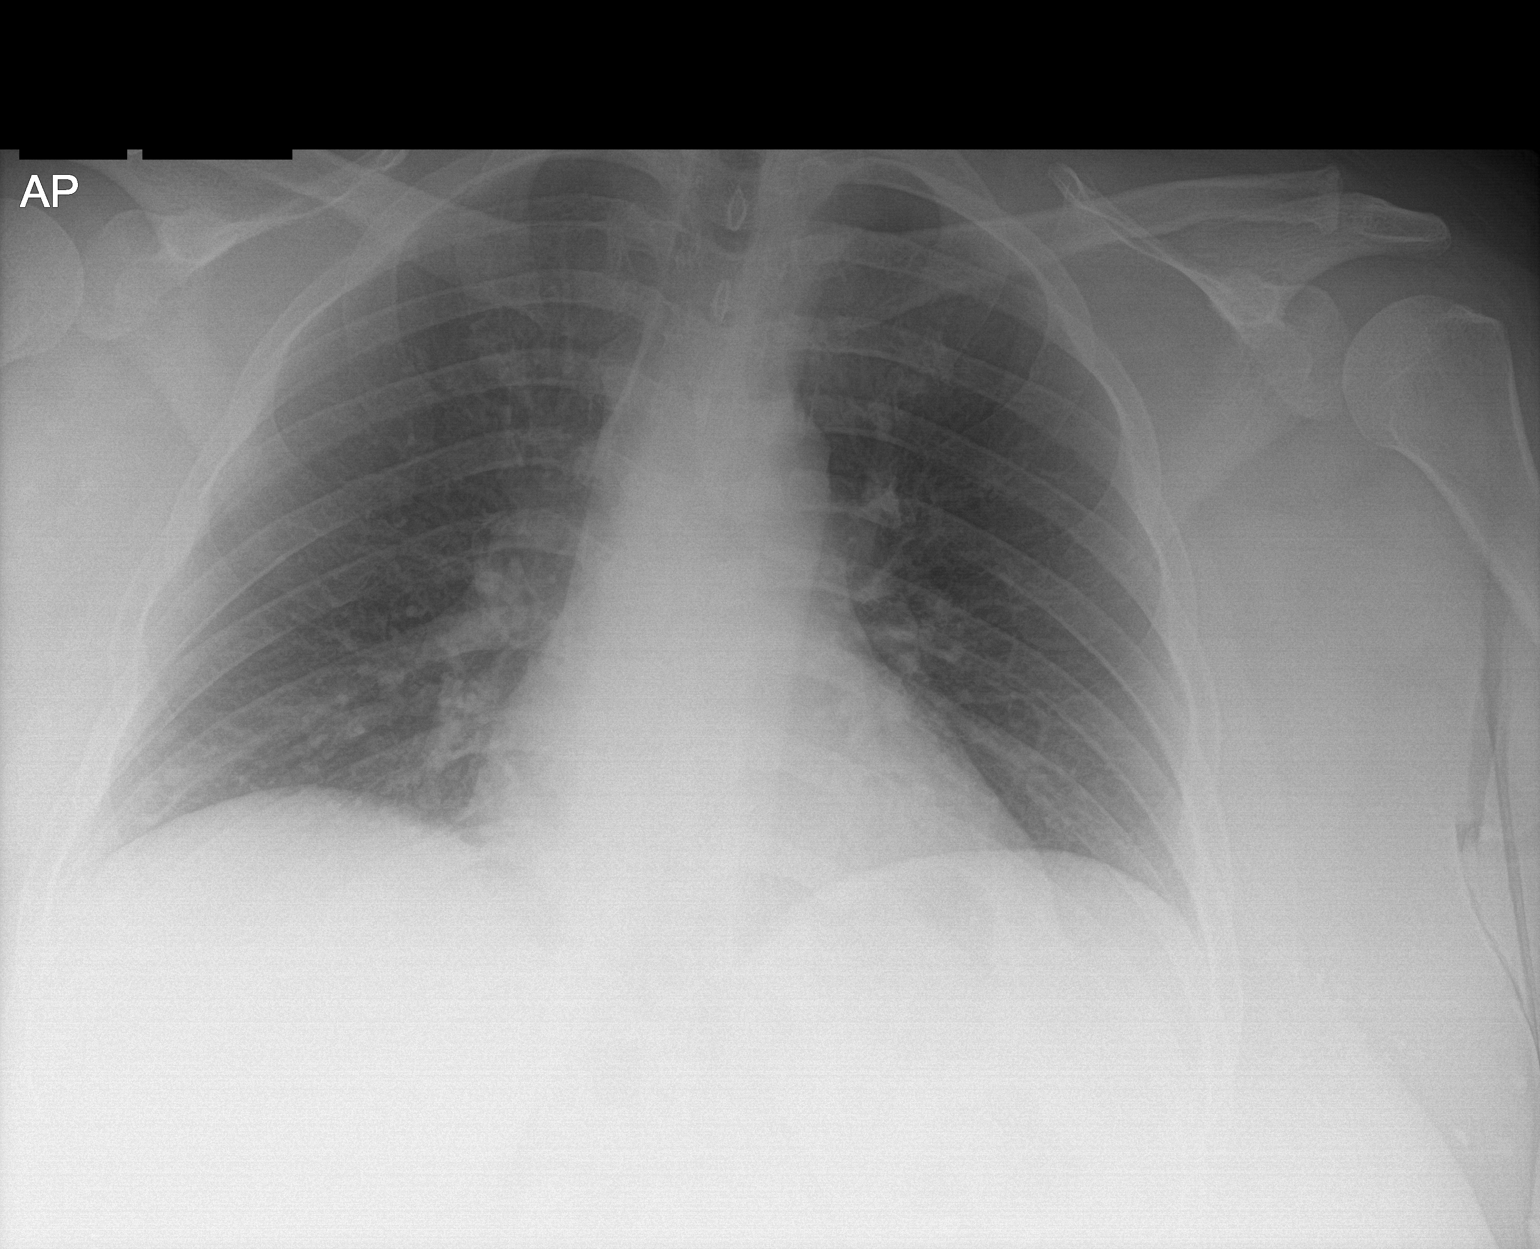

[1 of 1 positions shown; findings below may reference images not displayed]

FINDINGS: The heart size and mediastinal contours are within normal limits.
Both lungs are clear. The visualized skeletal structures are
unremarkable.
IMPRESSION: No active disease.

## 2023-02-13 IMAGING — DX DG CHEST 1V PORT
1 series · 1 of 1 positions shown · non-contrast
Comparison: 09/21/2020

CLINICAL DATA: Cough

EXAM:
PORTABLE CHEST 1 VIEW

[chest ap]
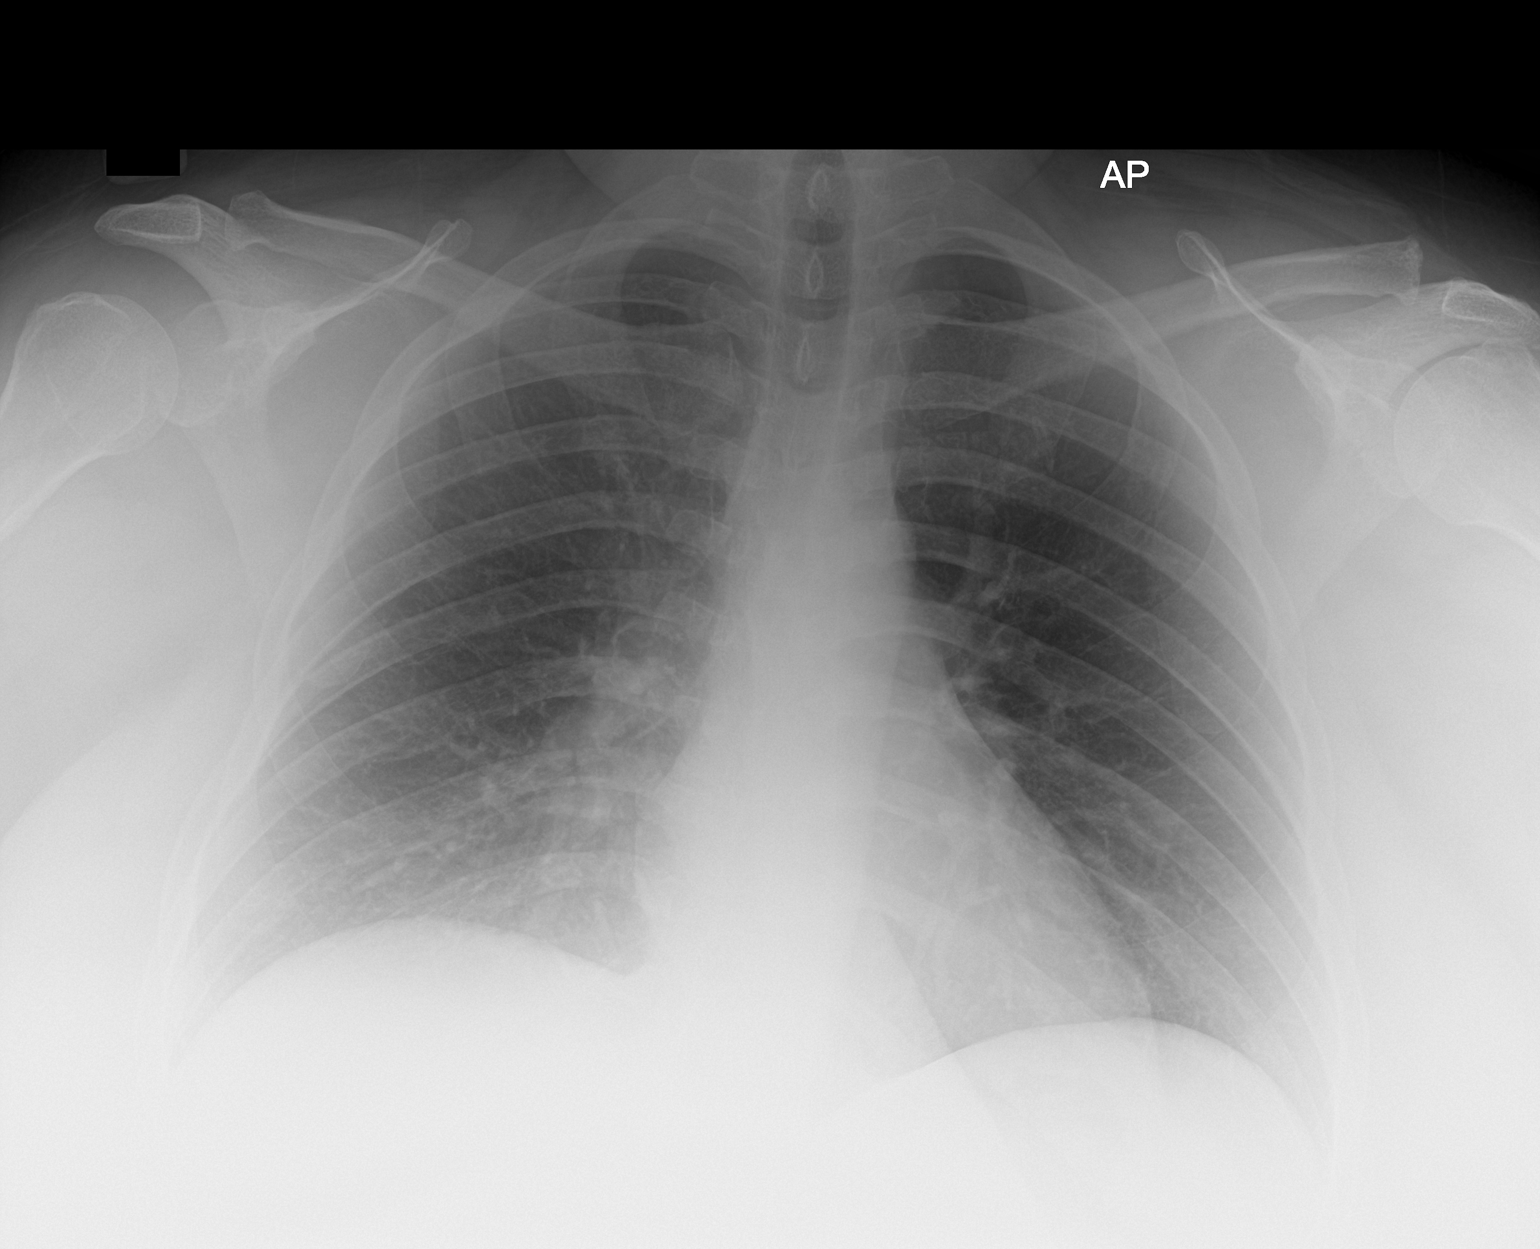

[1 of 1 positions shown; findings below may reference images not displayed]

FINDINGS: Increasing right infrahilar airspace opacity. Left lung clear. No
effusions. Heart is normal size. No acute bony abnormality.
IMPRESSION: Vague right infrahilar opacity could reflect developing pneumonia.
